# Patient Record
Sex: Male | Born: 1990 | Hispanic: No | Marital: Married | Smoking: Current some day smoker
Health system: Southern US, Community
[De-identification: ages and names within clinical notes are randomized; demographics above are authoritative.]

## PROBLEM LIST (undated history)

## (undated) DIAGNOSIS — M791 Myalgia, unspecified site: Secondary | ICD-10-CM

## (undated) DIAGNOSIS — Z0000 Encounter for general adult medical examination without abnormal findings: Secondary | ICD-10-CM

## (undated) HISTORY — PX: OTHER SURGICAL HISTORY: SHX169

## (undated) HISTORY — DX: Encounter for general adult medical examination without abnormal findings: Z00.00

---

## 2020-11-12 ENCOUNTER — Encounter: Payer: Self-pay | Admitting: Student

## 2020-11-24 ENCOUNTER — Encounter: Payer: Self-pay | Admitting: Student

## 2020-11-26 NOTE — Progress Notes (Unsigned)
Phone: [phone] In office visit-please note throughout visit Afghan interpreter was available/interpreting   Subjective:  Patient presents today to establish care.  Prior patient outside of country- from [place] .  Chief Complaint  Patient presents with  . New Patient (Initial Visit)  . GI Problem  . Waist problem     Left side of his waist    See problem oriented charting   The following were reviewed and entered/updated in epic: Past Medical History:  Diagnosis Date  . Healthy adult on routine physical examination    Patient Active Problem List   Diagnosis Date Noted  . GERD (gastroesophageal reflux disease) 11/27/2020   Past Surgical History:  Procedure Laterality Date  . none      Family History  Problem Relation Age of Onset  . Healthy Mother   . Hypertension Father   . Healthy Sister   . Healthy Brother   . Other Maternal Grandfather        died of "black magic"- disordered thinking reportedly  . Heart attack Paternal Grandfather        66s  . Healthy Sister   . Healthy Sister   . Healthy Sister   . Healthy Sister   . Healthy Brother   . Healthy Brother   . Healthy Brother     Medications- reviewed and updated Current Outpatient Medications  Medication Sig Dispense Refill  . cyclobenzaprine (FLEXERIL) 5 MG tablet Take 1 tablet (5 mg total) by mouth at bedtime as needed for muscle spasms (for left buttocks/hip pain). 30 tablet 1  . omeprazole (PRILOSEC) 40 MG capsule Take 1 capsule (40 mg total) by mouth daily. 30 capsule 1   No current facility-administered medications for this visit.    Allergies-reviewed and updated No Known Allergies  Social History   Social History Narrative   Married (wife in [place] with him). 1 daughter 9 years old in 2022. Has his own house but looking for housing downtown so he can go play. Has a sponsor helping them- Marca Ancona and wife.    Displaced from [place]      Working in a furniture company.    Was  working in [employer]      Hobbies: soccer/futbol    Objective  Objective:  BP 114/78   Pulse (!) 57   Temp (!) 97.2 F (36.2 C) (Temporal)   Ht 5\' 8"  (1.727 m)   Wt 180 lb 3.2 oz (81.7 kg)   SpO2 98%   BMI 27.40 kg/m  Gen: NAD, resting comfortably HEENT: Mucous membranes are moist. Oropharynx normal. TM normal. Eyes: sclera and lids normal, PERRLA but medial lacrimal caruncle with a linear dark area-referred to ophthalmology Neck: no thyromegaly, no cervical lymphadenopathy CV: RRR no murmurs rubs or gallops Lungs: CTAB no crackles, wheeze, rhonchi Abdomen: soft/nontender/nondistended/normal bowel sounds. No rebound or guarding.  Ext: no edema Skin: warm, dry MSK good range of motion of left hip-no pain with direct palpation over left lateral hip but did have pain with palpation over left lateral upper buttocks   Assessment and Plan:   # Epigastric burning S:when he was 30 years old was feeling a burning sensation in upper stomach/epigastric area- read some information that drinking fluids would help so he began to drink a lot of fluids but did not help. He visited 26 doctor who gave him a medicine that really helped.  With higher stress would still have symptoms.   Was told he could eat pretty much anything but  to avoid drinks with any bubbles/beverages like red bull, ginseng, monster drinks. Worse for 2-3 weeks uif he drinks these. Feels better if just drinks water. If he doesn't drink water he feels worse.   Pain all in epigastric area and into lower chest- burning sensation- rates can be severe. Worse if he gets hungry or thirsty.   Feels like he tastes something acidic. Not sure if worse at night.  A/P: Epigastric burning sounds like it has been a recurrent issue for years not improved on what sounds to likely be antireflux medicine and more GERD-based diet. Also since symptoms worsen when he is hungry there is a chance of duodenal ulcer-we opted to treat with  omeprazole 40 mg for 2 months. He will reach out if symptoms fail to improve or if they worsen and we suggested a 30-month follow-up. -Since we do not have any baseline labs will get CBC, CMP, lipase. Also will screen lipids -No shortness of breath or exertional chest pain reported-strongly doubt cardiac cause  # Left hip pain S:fall onto left hip as part of a mission 8 months ago- pain improved then worsened again in last month or two - worse when on feet at work-Prolonged standing at work. With heavy lifting can radiate into left low back. His wife provides pressure on upper lateral buttocks on the left side and that seems to give the most relief for pain A/P: Patient primarily points over left lateral hip area but with palpation pain seems to be worse in left upper lateral buttocks-we opted to get x-rays today to rule out any bony abnormality. Patient had pretty good range of motion of the hips without pain. I think this is most likely muscular in origin given improvement with deeper massage by wife. Considered NSAIDs but with possible reflux did not think the best first choice. Since seems to be muscular-trial of muscle relaxant before bed. -Depending on progress may need referral to sports medicine  # eye lesion  S:noted dark lesion on inner portion of eye several months ago- tried to remove on his own without success A/P: Dark linear lesion of lacrimal caruncle-could certainly be a benign nevus but I have not seen many like this-I would like to get expert opinion of ophthalmology-referral was placed today   Recommended follow up: Return in about 2 months (around 01/25/2021) for follow up- or sooner if needed.  Meds ordered this encounter  Medications  . omeprazole (PRILOSEC) 40 MG capsule    Sig: Take 1 capsule (40 mg total) by mouth daily.    Dispense:  30 capsule    Refill:  1  . cyclobenzaprine (FLEXERIL) 5 MG tablet    Sig: Take 1 tablet (5 mg total) by mouth at bedtime as needed for  muscle spasms (for left buttocks/hip pain).    Dispense:  30 tablet    Refill:  1   Time Spent: 61 minutes of total time (2:17 PM- 3:08 PM, 8:12 PM- 8:22 PM) was spent on the date of the encounter performing the following actions: chart review prior to seeing the patient, obtaining history (this took increased time due to needed interpretation), performing a medically necessary exam, counseling on the treatment plan, placing orders, and documenting in our EHR.   Return precautions advised. Tana Conch, MD

## 2020-11-26 NOTE — Patient Instructions (Addendum)
Please stop by lab AND x-ray before you go- same location  For acid reflux- I want you to take omeprazole every day before breakfast.   For your hip - we will get x-ray today. I would try heating pad and massage since the pressure provides the most relief. Could also try a muscle relaxant before bed cyclobenzaprine  Stick with water- avoid beverages like sodas or energy drinks. See diet discussion below  We will call you within two weeks about your referral to eye doctor to look at lesion in left eye. If you do not hear within 2 weeks, give [place] a call.    Recommended follow up: Return in about 2 months (around 01/25/2021) for follow up- or sooner if needed.   Food Choices for Gastroesophageal Reflux Disease, Adult When you have gastroesophageal reflux disease (GERD), the foods you eat and your eating habits are very important. Choosing the right foods can help ease your discomfort. Think about working with a food expert (dietitian) to help you make good choices. What are tips for following this plan? Reading food labels  Look for foods that are low in saturated fat. Foods that may help with your symptoms include: ? Foods that have less than 5% of daily value (DV) of fat. ? Foods that have 0 grams of trans fat. Cooking  Do not fry your food.  Cook your food by baking, steaming, grilling, or broiling. These are all methods that do not need a lot of fat for cooking.  To add flavor, try to use herbs that are low in spice and acidity. Meal planning  Choose healthy foods that are low in fat, such as: ? Fruits and vegetables. ? Whole grains. ? Low-fat dairy products. ? Lean meats, fish, and poultry.  Eat small meals often instead of eating 3 large meals each day. Eat your meals slowly in a place where you are relaxed. Avoid bending over or lying down until 2-3 hours after eating.  Limit high-fat foods such as fatty meats or fried foods.  Limit your intake of fatty foods, such as oils,  butter, and shortening.  Avoid the following as told by your doctor: ? Foods that cause symptoms. These may be different for different people. Keep a food diary to keep track of foods that cause symptoms. ? Alcohol. ? Drinking a lot of liquid with meals. ? Eating meals during the 2-3 hours before bed.   Lifestyle  Stay at a healthy weight. Ask your doctor what weight is healthy for you. If you need to lose weight, work with your doctor to do so safely.  Exercise for at least 30 minutes on 5 or more days each week, or as told by your doctor.  Wear loose-fitting clothes.  Do not smoke or use any products that contain nicotine or tobacco. If you need help quitting, ask your doctor.  Sleep with the head of your bed higher than your feet. Use a wedge under the mattress or blocks under the bed frame to raise the head of the bed.  Chew sugar-free gum after meals. What foods should eat? Eat a healthy, well-balanced diet of fruits, vegetables, whole grains, low-fat dairy products, lean meats, fish, and poultry. Each person is different. Foods that may cause symptoms in one person may not cause any symptoms in another person. Work with your doctor to find foods that are safe for you. The items listed above may not be a complete list of what you can eat and drink.  Contact a food expert for more options.   What foods should I avoid? Limiting some of these foods may help in managing the symptoms of GERD. Everyone is different. Talk with a food expert or your doctor to help you find the exact foods to avoid, if any. Fruits Any fruits prepared with added fat. Any fruits that cause symptoms. For some people, this may include citrus fruits, such as oranges, grapefruit, pineapple, and lemons. Vegetables Deep-fried vegetables. [place] fries. Any vegetables prepared with added fat. Any vegetables that cause symptoms. For some people, this may include tomatoes and tomato products, chili peppers, onions and  garlic, and horseradish. Grains Pastries or quick breads with added fat. Meats and other proteins High-fat meats, such as fatty beef or pork, hot dogs, ribs, ham, sausage, salami, and bacon. Fried meat or protein, including fried fish and fried chicken. Nuts and nut butters, in large amounts. Dairy Whole milk and chocolate milk. Sour cream. Cream. Ice cream. Cream cheese. Milkshakes. Fats and oils Butter. Margarine. Shortening. Ghee. Beverages Coffee and tea, with or without caffeine. Carbonated beverages. Sodas. Energy drinks. Fruit juice made with acidic fruits, such as orange or grapefruit. Tomato juice. Alcoholic drinks. Sweets and desserts Chocolate and cocoa. Donuts. Seasonings and condiments Pepper. Peppermint and spearmint. Added salt. Any condiments, herbs, or seasonings that cause symptoms. For some people, this may include curry, hot sauce, or vinegar-based salad dressings. The items listed above may not be a complete list of what you should not eat and drink. Contact a food expert for more options. Questions to ask your doctor Diet and lifestyle changes are often the first steps that are taken to manage symptoms of GERD. If diet and lifestyle changes do not help, talk with your doctor about taking medicines. Where to find more information  International Foundation for Gastrointestinal Disorders: aboutgerd.org Summary  When you have GERD, food and lifestyle choices are very important in easing your symptoms.  Eat small meals often instead of 3 large meals a day. Eat your meals slowly and in a place where you are relaxed.  Avoid bending over or lying down until 2-3 hours after eating.  Limit high-fat foods such as fatty meats or fried foods. This information is not intended to replace advice given to you by your health care provider. Make sure you discuss any questions you have with your health care provider. Document Revised: 03/31/2020 Document Reviewed:  03/31/2020 Elsevier Patient Education  2021 [employer].

## 2020-11-27 ENCOUNTER — Ambulatory Visit (INDEPENDENT_AMBULATORY_CARE_PROVIDER_SITE_OTHER): Payer: Medicaid Other | Admitting: Family Medicine

## 2020-11-27 ENCOUNTER — Other Ambulatory Visit: Payer: Self-pay

## 2020-11-27 ENCOUNTER — Encounter: Payer: Self-pay | Admitting: Family Medicine

## 2020-11-27 ENCOUNTER — Ambulatory Visit (INDEPENDENT_AMBULATORY_CARE_PROVIDER_SITE_OTHER): Payer: Medicaid Other

## 2020-11-27 VITALS — BP 114/78 | HR 57 | Temp 97.2°F | Ht 68.0 in | Wt 180.2 lb

## 2020-11-27 DIAGNOSIS — H579 Unspecified disorder of eye and adnexa: Secondary | ICD-10-CM

## 2020-11-27 DIAGNOSIS — R1013 Epigastric pain: Secondary | ICD-10-CM | POA: Diagnosis not present

## 2020-11-27 DIAGNOSIS — Z13220 Encounter for screening for lipoid disorders: Secondary | ICD-10-CM

## 2020-11-27 DIAGNOSIS — K219 Gastro-esophageal reflux disease without esophagitis: Secondary | ICD-10-CM | POA: Diagnosis not present

## 2020-11-27 DIAGNOSIS — Z1159 Encounter for screening for other viral diseases: Secondary | ICD-10-CM

## 2020-11-27 DIAGNOSIS — M25552 Pain in left hip: Secondary | ICD-10-CM | POA: Diagnosis not present

## 2020-11-27 DIAGNOSIS — Z114 Encounter for screening for human immunodeficiency virus [HIV]: Secondary | ICD-10-CM

## 2020-11-27 MED ORDER — OMEPRAZOLE 40 MG PO CPDR: 40.0000 mg | DELAYED_RELEASE_CAPSULE | Freq: Every day | ORAL | 1 refills | Status: DC

## 2020-11-27 MED ORDER — CYCLOBENZAPRINE HCL 5 MG PO TABS: 5.0000 mg | ORAL_TABLET | Freq: Every evening | ORAL | 1 refills | Status: DC | PRN

## 2020-11-28 LAB — COMPREHENSIVE METABOLIC PANEL
ALT: 20 U/L (ref 0–53)
AST: 21 U/L (ref 0–37)
Albumin: 4.7 g/dL (ref 3.5–5.2)
Alkaline Phosphatase: 82 U/L (ref 39–117)
BUN: 12 mg/dL (ref 6–23)
CO2: 29 mEq/L (ref 19–32)
Calcium: 9.4 mg/dL (ref 8.4–10.5)
Chloride: 103 mEq/L (ref 96–112)
Creatinine, Ser: 0.95 mg/dL (ref 0.40–1.50)
GFR: 107.98 mL/min (ref >60.00)
Glucose, Bld: 100 mg/dL — ABNORMAL HIGH (ref 70–99)
Potassium: 3.8 mEq/L (ref 3.5–5.1)
Sodium: 140 mEq/L (ref 135–145)
Total Bilirubin: 0.5 mg/dL (ref 0.2–1.2)
Total Protein: 7.4 g/dL (ref 6.0–8.3)

## 2020-11-28 LAB — CBC WITH DIFFERENTIAL/PLATELET
Basophils Absolute: 0.1 10*3/uL (ref 0.0–0.1)
Basophils Relative: 1.2 % (ref 0.0–3.0)
Eosinophils Absolute: 0.5 10*3/uL (ref 0.0–0.7)
Eosinophils Relative: 10.4 % — ABNORMAL HIGH (ref 0.0–5.0)
HCT: 39.6 % (ref 39.0–52.0)
Hemoglobin: 12.2 g/dL — ABNORMAL LOW (ref 13.0–17.0)
Lymphocytes Relative: 32.8 % (ref 12.0–46.0)
Lymphs Abs: 1.7 10*3/uL (ref 0.7–4.0)
MCHC: 30.8 g/dL (ref 30.0–36.0)
MCV: 60.6 fl — ABNORMAL LOW (ref 78.0–100.0)
Monocytes Absolute: 0.4 10*3/uL (ref 0.1–1.0)
Monocytes Relative: 7.5 % (ref 3.0–12.0)
Neutro Abs: 2.4 10*3/uL (ref 1.4–7.7)
Neutrophils Relative %: 48.1 % (ref 43.0–77.0)
Platelets: 186 10*3/uL (ref 150.0–400.0)
RBC: 6.54 Mil/uL — ABNORMAL HIGH (ref 4.22–5.81)
RDW: 16.1 % — ABNORMAL HIGH (ref 11.5–15.5)
WBC: 5 10*3/uL (ref 4.0–10.5)

## 2020-11-28 LAB — LIPID PANEL
Cholesterol: 145 mg/dL (ref 0–200)
HDL: 33.7 mg/dL — ABNORMAL LOW (ref >39.00)
LDL Cholesterol: 79 mg/dL (ref 0–99)
NonHDL: 111.35
Total CHOL/HDL Ratio: 4
Triglycerides: 160 mg/dL — ABNORMAL HIGH (ref 0.0–149.0)
VLDL: 32 mg/dL (ref 0.0–40.0)

## 2020-11-28 LAB — LIPASE: Lipase: 23 U/L (ref 11.0–59.0)

## 2020-11-28 LAB — HIV ANTIBODY (ROUTINE TESTING W REFLEX): HIV 1&2 Ab, 4th Generation: NONREACTIVE

## 2020-11-28 LAB — HEPATITIS C ANTIBODY
Hepatitis C Ab: NONREACTIVE
SIGNAL TO CUT-OFF: 0.27 (ref <1.00)

## 2020-12-11 ENCOUNTER — Telehealth: Payer: Self-pay

## 2020-12-11 ENCOUNTER — Encounter (HOSPITAL_BASED_OUTPATIENT_CLINIC_OR_DEPARTMENT_OTHER): Payer: Self-pay | Admitting: *Deleted

## 2020-12-11 ENCOUNTER — Emergency Department (HOSPITAL_BASED_OUTPATIENT_CLINIC_OR_DEPARTMENT_OTHER)
Admission: EM | Admit: 2020-12-11 | Discharge: 2020-12-11 | Disposition: A | Discharge status: Home / Self Care | Payer: Medicaid Other | Attending: Emergency Medicine | Admitting: Emergency Medicine

## 2020-12-11 ENCOUNTER — Emergency Department (HOSPITAL_BASED_OUTPATIENT_CLINIC_OR_DEPARTMENT_OTHER): Payer: Medicaid Other

## 2020-12-11 ENCOUNTER — Other Ambulatory Visit: Payer: Self-pay

## 2020-12-11 DIAGNOSIS — F17200 Nicotine dependence, unspecified, uncomplicated: Secondary | ICD-10-CM | POA: Diagnosis not present

## 2020-12-11 DIAGNOSIS — K2900 Acute gastritis without bleeding: Secondary | ICD-10-CM | POA: Insufficient documentation

## 2020-12-11 DIAGNOSIS — R1013 Epigastric pain: Secondary | ICD-10-CM | POA: Diagnosis present

## 2020-12-11 DIAGNOSIS — K219 Gastro-esophageal reflux disease without esophagitis: Secondary | ICD-10-CM | POA: Insufficient documentation

## 2020-12-11 DIAGNOSIS — R1011 Right upper quadrant pain: Secondary | ICD-10-CM

## 2020-12-11 HISTORY — DX: Myalgia, unspecified site: M79.10

## 2020-12-11 LAB — COMPREHENSIVE METABOLIC PANEL
ALT: 17 U/L (ref 0–44)
AST: 17 U/L (ref 15–41)
Albumin: 4.6 g/dL (ref 3.5–5.0)
Alkaline Phosphatase: 74 U/L (ref 38–126)
Anion gap: 7 (ref 5–15)
BUN: 13 mg/dL (ref 6–20)
CO2: 27 mmol/L (ref 22–32)
Calcium: 9.2 mg/dL (ref 8.9–10.3)
Chloride: 105 mmol/L (ref 98–111)
Creatinine, Ser: 1.03 mg/dL (ref 0.61–1.24)
GFR, Estimated: >60 mL/min (ref >60)
Glucose, Bld: 118 mg/dL — ABNORMAL HIGH (ref 70–99)
Potassium: 4 mmol/L (ref 3.5–5.1)
Sodium: 139 mmol/L (ref 135–145)
Total Bilirubin: 0.5 mg/dL (ref 0.3–1.2)
Total Protein: 7.4 g/dL (ref 6.5–8.1)

## 2020-12-11 LAB — CBC
HCT: 41 % (ref 39.0–52.0)
Hemoglobin: 12.2 g/dL — ABNORMAL LOW (ref 13.0–17.0)
MCH: 18.4 pg — ABNORMAL LOW (ref 26.0–34.0)
MCHC: 29.8 g/dL — ABNORMAL LOW (ref 30.0–36.0)
MCV: 61.9 fL — ABNORMAL LOW (ref 80.0–100.0)
Platelets: 171 10*3/uL (ref 150–400)
RBC: 6.62 MIL/uL — ABNORMAL HIGH (ref 4.22–5.81)
RDW: 17.5 % — ABNORMAL HIGH (ref 11.5–15.5)
WBC: 5.4 10*3/uL (ref 4.0–10.5)
nRBC: 0 % (ref 0.0–0.2)

## 2020-12-11 LAB — LIPASE, BLOOD: Lipase: 27 U/L (ref 11–51)

## 2020-12-11 MED ORDER — LIDOCAINE VISCOUS HCL 2 % MT SOLN
15.0000 mL | Freq: Once | OROMUCOSAL | Status: AC
  Administered 2020-12-11: 15 mL via ORAL
  Filled 2020-12-11: qty 15

## 2020-12-11 MED ORDER — SUCRALFATE 1 G PO TABS: 1.0000 g | ORAL_TABLET | Freq: Three times a day (TID) | ORAL | 0 refills | Status: DC

## 2020-12-11 MED ORDER — PANTOPRAZOLE SODIUM 40 MG IV SOLR
40.0000 mg | Freq: Once | INTRAVENOUS | Status: AC
  Administered 2020-12-11: 40 mg via INTRAVENOUS
  Filled 2020-12-11: qty 40

## 2020-12-11 MED ORDER — ALUM & MAG HYDROXIDE-SIMETH 200-200-20 MG/5ML PO SUSP
30.0000 mL | Freq: Once | ORAL | Status: AC
  Administered 2020-12-11: 30 mL via ORAL
  Filled 2020-12-11: qty 30

## 2020-12-11 NOTE — ED Triage Notes (Addendum)
Abdominal pain for 5 years while he was in [place] and when he is in the [place] it is bothering him again. Poor appetite, sometimes he feels nauseous and sick to his stomach.  Burning sensation specially in the morning.

## 2020-12-11 NOTE — Telephone Encounter (Signed)
Pt went to ER

## 2020-12-11 NOTE — ED Provider Notes (Signed)
[facility] EMERGENCY DEPT Provider Note   CSN: [id number] Arrival date & time: 12/11/20  1701     History Chief Complaint  Patient presents with  . Abdominal Pain    Allen Berger is a 30 y.o. male.  Patient is a 30 year old male who presents with epigastric pain.  History is obtained through video language line.  Patient speaks Parsi.  He reports that he has had some pain in his epigastric area for about 5 years.  He was treated for this when he was in [place].  He recently has had some increase in symptoms since he moved to the [place].  He describes pain in his epigastrium.  Nonradiating.  He has some occasional nausea but no vomiting.  No radiation to his chest.  No shortness of breath.  No fevers.  No change in bowels.  He has been taking omeprazole with no improvement in symptoms.  He says is not really related to eating.  It hurts whether or not he eats.  He he was treated for it in [place] but he says he never got a formal diagnosis.        Past Medical History:  Diagnosis Date  . Healthy adult on routine physical examination   . Muscle pain     Patient Active Problem List   Diagnosis Date Noted  . GERD (gastroesophageal reflux disease) 11/27/2020    Past Surgical History:  Procedure Laterality Date  . none         Family History  Problem Relation Age of Onset  . Healthy Mother   . Hypertension Father   . Healthy Sister   . Healthy Brother   . Other Maternal Grandfather        died of "black magic"- disordered thinking reportedly  . Heart attack Paternal Grandfather        38s  . Healthy Sister   . Healthy Sister   . Healthy Sister   . Healthy Sister   . Healthy Brother   . Healthy Brother   . Healthy Brother     Social History   Tobacco Use  . Smoking status: Current Some Day Smoker    Start date: 11/20/2020  . Smokeless tobacco: Never Used  . Tobacco comment: about 2 a day now- heavier in [place]  Vaping Use  .  Vaping Use: Never used  Substance Use Topics  . Alcohol use: Not Currently    Comment: rare social  . Drug use: Never    Home Medications Prior to Admission medications   Medication Sig Start Date End Date Taking? Authorizing Provider  sucralfate (CARAFATE) 1 g tablet Take 1 tablet (1 g total) by mouth 4 (four) times daily -  with meals and at bedtime. 12/11/20  Yes Rolan Bucco, MD  cyclobenzaprine (FLEXERIL) 5 MG tablet Take 1 tablet (5 mg total) by mouth at bedtime as needed for muscle spasms (for left buttocks/hip pain). 11/27/20   Shelva Majestic, MD  omeprazole (PRILOSEC) 40 MG capsule Take 1 capsule (40 mg total) by mouth daily. 11/27/20   Shelva Majestic, MD    Allergies    Patient has no known allergies.  Review of Systems   Review of Systems  Constitutional: Negative for chills, diaphoresis, fatigue and fever.  HENT: Negative for congestion, rhinorrhea and sneezing.   Eyes: Negative.   Respiratory: Negative for cough, chest tightness and shortness of breath.   Cardiovascular: Negative for chest pain and leg swelling.  Gastrointestinal: Positive for  abdominal pain and nausea. Negative for blood in stool, diarrhea and vomiting.  Genitourinary: Negative for difficulty urinating, flank pain, frequency and hematuria.  Musculoskeletal: Negative for arthralgias and back pain.  Skin: Negative for rash.  Neurological: Negative for dizziness, speech difficulty, weakness, numbness and headaches.    Physical Exam Updated Vital Signs BP 115/65   Pulse 71   Temp 98.2 F (36.8 C) (Oral)   Resp 18   Ht 5\' 8"  (1.727 m)   Wt 79 kg   SpO2 98%   BMI 26.48 kg/m   Physical Exam Constitutional:      Appearance: He is well-developed.  HENT:     Head: Normocephalic and atraumatic.  Eyes:     Pupils: Pupils are equal, round, and reactive to light.  Cardiovascular:     Rate and Rhythm: Normal rate and regular rhythm.     Heart sounds: Normal heart sounds.  Pulmonary:      Effort: Pulmonary effort is normal. No respiratory distress.     Breath sounds: Normal breath sounds. No wheezing or rales.  Chest:     Chest wall: No tenderness.  Abdominal:     General: Bowel sounds are normal.     Palpations: Abdomen is soft.     Tenderness: There is abdominal tenderness in the epigastric area. There is no guarding or rebound.  Musculoskeletal:        General: Normal range of motion.     Cervical back: Normal range of motion and neck supple.  Lymphadenopathy:     Cervical: No cervical adenopathy.  Skin:    General: Skin is warm and dry.     Findings: No rash.  Neurological:     Mental Status: He is alert and oriented to person, place, and time.     ED Results / Procedures / Treatments   Labs (all labs ordered are listed, but only abnormal results are displayed) Labs Reviewed  CBC - Abnormal; Notable for the following components:      Result Value   RBC 6.62 (*)    Hemoglobin 12.2 (*)    MCV 61.9 (*)    MCH 18.4 (*)    MCHC 29.8 (*)    RDW 17.5 (*)    All other components within normal limits  COMPREHENSIVE METABOLIC PANEL - Abnormal; Notable for the following components:   Glucose, Bld 118 (*)    All other components within normal limits  LIPASE, BLOOD    EKG None   ED ECG REPORT   Date: 12/11/2020  EKG Time: 9:09 PM  Rate: 58  Rhythm: normal sinus rhythm,  normal EKG, normal sinus rhythm, there are no previous tracings available for comparison  Axis: normal  Intervals:none  ST&T Change: none  Narrative Interpretation: none            Radiology 02/10/2021 Abdomen Limited RUQ (LIVER/GB)  Result Date: 12/11/2020 CLINICAL DATA:  Right upper quadrant pain EXAM: ULTRASOUND ABDOMEN LIMITED RIGHT UPPER QUADRANT COMPARISON:  None. FINDINGS: Gallbladder: No gallstones or wall thickening visualized. No sonographic Murphy sign noted by sonographer. Common bile duct: Diameter: 3 mm Liver: Mild increased echogenicity is noted likely related to fatty  infiltration. No focal mass is seen. Portal vein is patent on color Doppler imaging with normal direction of blood flow towards the liver. Other: None. IMPRESSION: Mild changes of fatty infiltration. No other focal abnormality noted. Electronically Signed   By: 02/10/2021 M.D.   On: 12/11/2020 20:34    Procedures Procedures  Medications Ordered in ED Medications  alum & mag hydroxide-simeth (MAALOX/MYLANTA) 200-200-20 MG/5ML suspension 30 mL (30 mLs Oral Given 12/11/20 1924)    And  lidocaine (XYLOCAINE) 2 % viscous mouth solution 15 mL (15 mLs Oral Given 12/11/20 1923)  pantoprazole (PROTONIX) injection 40 mg (40 mg Intravenous Given 12/11/20 1924)    ED Course  I have reviewed the triage vital signs and the nursing notes.  Pertinent labs & imaging results that were available during my care of the patient were reviewed by me and considered in my medical decision making (see chart for details).    MDM Rules/Calculators/A&P                          Patient is a 30 year old male who presents with epigastric pain which he describes as a burning sensation.  This is been going on for about 5 years but has recently gotten worse.  His symptoms sound consistent with gastritis versus peptic ulcer disease.  He recently establish care with a PCP here and was started on Prilosec.  He does not report any improvement in symptoms on this.  His labs are nonconcerning.  There is no suggestions of pancreatitis.  He had an ultrasound which shows no evidence of gallbladder disease.  He was given a GI cocktail which did not significantly help his symptoms.  We will add Carafate to his Prilosec regimen and have him follow back up with his PCP.  He may need an EGD.  Patient was discharged home in good condition.  He was given instructions on a GERD diet.  Return precautions given.  All instructions were given per [place] interpreter. Final Clinical Impression(s) / ED Diagnoses Final diagnoses:  RUQ pain  Acute  gastritis without hemorrhage, unspecified gastritis type    Rx / DC Orders ED Discharge Orders         Ordered    sucralfate (CARAFATE) 1 g tablet  3 times daily with meals & bedtime        12/11/20 2109           Rolan Bucco, MD 12/11/20 2111

## 2020-12-11 NOTE — Telephone Encounter (Signed)
Pt is being sent to the ED by our Nurse Triage. Pt will be missing work for this. Pt is requesting a note that can be picked up tomorrow morning at 10 am, stating that our office advised pt to go to ED tonight.

## 2020-12-11 NOTE — Telephone Encounter (Signed)
Nurse Assessment Nurse: Anner Crete, RN, Olegario Messier Date/Time (Eastern Time): 12/11/2020 3:23:06 PM Confirm and document reason for call. If symptomatic, describe symptoms. ---Caller stated he is having severe abdominal pain. Does the patient have any new or worsening symptoms? ---Yes Will a triage be completed? ---Yes Related visit to physician within the last 2 weeks? ---Yes Does the PT have any chronic conditions? (i.e. diabetes, asthma, this includes High risk factors for pregnancy, etc.) ---No Is this a behavioral health or substance abuse call? ---No Guidelines Guideline Title Affirmed Question Affirmed Notes Nurse Date/Time Lamount Cohen Time) Abdominal Pain - Upper [1] SEVERE pain (e.g., excruciating) AND [2] present > 1 hour Wells, RN, Olegario Messier 12/11/2020 3:33:20 PM Disp. Time Lamount Cohen Time) Disposition Final User 12/11/2020 3:19:30 PM Send to Urgent Danella Penton 12/11/2020 3:36:31 PM Go to ED Now Yes Anner Crete, RN, Sammuel Bailiff Disagree/Comply Disagree PLEASE NOTE: All timestamps contained within this report are represented as [place] Standard Time. CONFIDENTIALTY NOTICE: This fax transmission is intended only for the addressee. It contains information that is legally privileged, confidential or otherwise protected from use or disclosure. If you are not the intended recipient, you are strictly prohibited from reviewing, disclosing, copying using or disseminating any of this information or taking any action in reliance on or regarding this information. If you have received this fax in error, please notify us immediately by telephone so that we can arrange for its return to [place]. Phone: [phone], Toll-Free: [phone], Fax: [phone] Page: 2 of 2 Call Id: [id number] Caller Understands Yes PreDisposition Call Doctor Care Advice Given Per Guideline GO TO ED NOW: * You need to be seen in the Emergency Department. * Another adult should drive. NOTHING BY MOUTH: * Do not eat or drink anything  for now. CARE ADVICE given per Abdominal Pain, Upper (Adult) guideline. * Leave now. Drive carefully. * Bring a list of your current medicines when you go to the Emergency Department (ER). Comments User: Alphonzo Cruise, RN Date/Time Lamount Cohen Time): 12/11/2020 3:37:46 PM Persian Interpreter # [id number] User: Alphonzo Cruise, RN Date/Time Lamount Cohen Time): 12/11/2020 3:40:51 PM after my previous documentation partient changed his mind and does plan to go to the ER Referrals GO TO FACILITY REFUSE

## 2020-12-11 NOTE — Telephone Encounter (Signed)
[facility] - Client TELEPHONE ADVICE RECORD AccessNurse Patient Name: Allen Berger Gender: Male DOB: 02/27/1991 Age: 30 Y 0 M 0 D Return Phone Number: [phone] (Primary) Address: City/State/Zip: [place] [address] Client [facility] - Client Client Site [facility] Physician Hunter, Stephen- MD Contact Type Call Who Is Calling Patient / Member / Family / Caregiver Call Type Triage / Clinical Relationship To Patient Self Return Phone Number [phone] (Primary) Chief Complaint ABDOMINAL PAIN - Severe and only in abdomen Reason for Call Symptomatic / Request for Health Information Initial Comment Caller Claire from the clinic, says pt does not speak good English. Pt has been having some abdominal pain and it has gotten worse today. Claire advised to speak slow. Translation No Nurse Assessment Nurse: Wells, RN, Kathy Date/Time (Eastern Time): 12/11/2020 3:23:06 PM Confirm and document reason for call. If symptomatic, describe symptoms. ---Caller stated he is having severe abdominal pain. Does the patient have any new or worsening symptoms? ---Yes Will a triage be completed? ---Yes Related visit to physician within the last 2 weeks? ---Yes Does the PT have any chronic conditions? (i.e. diabetes, asthma, this includes High risk factors for pregnancy, etc.) ---No Is this a behavioral health or substance abuse call? ---No Guidelines Guideline Title Affirmed Question Affirmed Notes Nurse Date/Time (Eastern Time) Abdominal Pain - Upper [1] SEVERE pain (e.g., excruciating) AND [2] present > 1 hour Wells, RN, Kathy 12/11/2020 3:33:20 PM Disp. Time (Eastern Time) Disposition Final User 12/11/2020 3:19:30 PM Send to Urgent Queue Hill, Laqundna 12/11/2020 3:36:31 PM Go to ED Now Yes Wells, RN, Kathy Caller Disagree/Comply Disagree PLEASE NOTE: All timestamps contained within this  report are represented as Eastern Standard Time. CONFIDENTIALTY NOTICE: This fax transmission is intended only for the addressee. It contains information that is legally privileged, confidential or otherwise protected from use or disclosure. If you are not the intended recipient, you are strictly prohibited from reviewing, disclosing, copying using or disseminating any of this information or taking any action in reliance on or regarding this information. If you have received this fax in error, please notify us immediately by telephone so that we can arrange for its return to us. Phone: [phone], Toll-Free: [phone], Fax: [phone] Page: 2 of 2 Call Id: [id number] Caller Understands Yes PreDisposition Call Doctor Care Advice Given Per Guideline GO TO ED NOW: * You need to be seen in the Emergency Department. * Another adult should drive. NOTHING BY MOUTH: * Do not eat or drink anything for now. CARE ADVICE given per Abdominal Pain, Upper (Adult) guideline. * Leave now. Drive carefully. * Bring a list of your current medicines when you go to the Emergency Department (ER). Comments User: Kathy, Wells, RN Date/Time (Eastern Time): 12/11/2020 3:37:46 PM Persian Interpreter # [id number] User: Kathy, Wells, RN Date/Time (Eastern Time): 12/11/2020 3:40:51 PM after my previous documentation partient changed his mind and does plan to go to the ER Referrals GO TO FACILITY REFUSE 

## 2020-12-12 ENCOUNTER — Ambulatory Visit: Payer: Medicaid Other | Admitting: Physician Assistant

## 2020-12-12 ENCOUNTER — Telehealth: Payer: Self-pay

## 2020-12-12 ENCOUNTER — Other Ambulatory Visit: Payer: Self-pay

## 2020-12-12 DIAGNOSIS — R1013 Epigastric pain: Secondary | ICD-10-CM

## 2020-12-12 NOTE — Telephone Encounter (Signed)
May refer to Dr. Rhea Belton under epigastric pain.  Please make sure patient knows he is on appropriate treatment if this is an ulcer-adding the Carafate may help his symptoms-it does take some time for ulcers to heal.  Also please note seeing Dr. Rhea Belton directly may take more time than doing a general referral to GI but if patient still desires Dr. Rhea Belton specifically-I am fine with that as I think is reasonable to see if continued use of the medicine will help further

## 2020-12-12 NOTE — Telephone Encounter (Signed)
Unable to get an interpreter on the phone. Referral has been placed but still need to speak with the patient.

## 2020-12-12 NOTE — Telephone Encounter (Signed)
Charlette Caffey called on patients behalf stating patient is in need of a referral to [facility]  to see dr J.purtle immediately for possibly needing an endoscopy. Patient was seen in ER last night for a stomach ulcer .

## 2020-12-12 NOTE — Telephone Encounter (Signed)
Please advise. Is it okay for me to place the referral.

## 2020-12-29 NOTE — Patient Instructions (Addendum)
Please take sheet to [facility] for semen analysis. Address: [address] [place], [place] [address] - Please call [phone] an appointment. We did not translate this below- if possible team/translator please help him call today  Team since we have an interpreter here- lets go ahead and set up visit with stomach doctors/GI - has been referred before  Continue prilosec/omeprazole but increase to twice a day before meals for acid reflux  Also refilled carafate which is the medicine that coats your stomach  Due to anemia lets have you take iron every 3 days- this may make your stool darker  Use antibacterial ointment four times a day as I showed you for 5-7 days. If any blurry vision- go to emergency room. If new or worsening symptoms otherwise such as more redness please see [place] back  You requested a Chad translation from google translate. We cannot guarantee medical accuracy of this- please review with translator and correct anything before you leave     ? ?  ? ? ?  ? ?. ?  /    ?- ~ ? ~   ?~   ?   ?  ?? ~   ? -  ??? ? ?   ~ /GI  ~? -     .  ?~/   ?  ? ~ ?         ? ?  ? ~    ~ ??  ~    ?    ? ~ ?  ?   3  ?~    ~? - ? ~     ?  ~   ~       5-7        ? ~?  ~?.    ? ? -  Z  ~?.   ? ?    ? ?   ? ?     ~?     ?   ? ~?.  ? ?  ~? ?   ? ~? -    ~   ? ~?   ??  ? ~?

## 2020-12-29 NOTE — Progress Notes (Signed)
Phone [phone] In person visit   Subjective:   Allen Berger is a 30 y.o. year old very pleasant male patient who presents for/with See problem oriented charting Chief Complaint  Patient presents with  . Gastroesophageal Reflux    Heartburn very bad   . Eye Problem    Patients right eye is red , burning sensation, 2-3 days    This visit occurred during the SARS-CoV-2 public health emergency.  Safety protocols were in place, including screening questions prior to the visit, additional usage of staff PPE, and extensive cleaning of exam room while observing appropriate contact time as indicated for disinfecting solutions.   Past Medical History-  Patient Active Problem List   Diagnosis Date Noted  . GERD (gastroesophageal reflux disease) 11/27/2020    Medications- reviewed and updated Current Outpatient Medications  Medication Sig Dispense Refill  . cyclobenzaprine (FLEXERIL) 5 MG tablet Take 1 tablet (5 mg total) by mouth at bedtime as needed for muscle spasms (for left buttocks/hip pain). 30 tablet 1  . erythromycin ophthalmic ointment One-half inch (1.25 cm) four times daily for 5 to 7 days to affected eye (left eye) 3.5 g 0  . ferrous sulfate 325 (65 FE) MG EC tablet Take 1 tablet (325 mg total) by mouth every 3 (three) days. 39 tablet 1  . omeprazole (PRILOSEC) 40 MG capsule Take 1 capsule (40 mg total) by mouth 2 (two) times daily before a meal. 60 capsule 1  . sucralfate (CARAFATE) 1 g tablet Take 1 tablet (1 g total) by mouth 4 (four) times daily -  with meals and at bedtime. 30 tablet 0   No current facility-administered medications for this visit.     Objective:  BP 115/70   Pulse 68   Temp 98.3 F (36.8 C) (Temporal)   Ht 5\' 8"  (1.727 m)   Wt 178 lb 9.6 oz (81 kg)   SpO2 100%   BMI 27.16 kg/m  Gen: NAD, resting comfortably Gross vision intact. Left eye erythematous on lateral portion more intensely. On fluorescein eye stain test- very slight focus of  fluorescein concentration with no extravasation-consistent with slight scleral abrasion. CV: RRR no murmurs rubs or gallops Lungs: CTAB no crackles, wheeze, rhonchi Abdomen: soft/nontender other than very mild tenderness with deep palpation epigastric area/nondistended/normal bowel sounds. No rebound or guarding.  Ext: no edema Skin: warm, dry MSK: Did not reexamine hip today    Assessment and Plan  # GERD thought as potential cause of epigastric abdominal pain though potential ulceration as well S:Medication: Prilosec 40Mg   Patient continues have epigastric abdominal pain that even led him to have emergency room visit on 12/11/2020 after being started on Prilosec 11/27/2020 in our office.  No radiation of pain.  Patient was seen in the emergency room approximately 20 days ago and had an ultrasound of right upper quadrant to evaluate pain-no gallstones were found.  Some fatty liver was noted.  Patient was already on Prilosec 40 mg once daily.  They added Carafate in the emergency room.  Pain prior to meds up to 10/10, now with prilosec as low as 2-3/10 but occasionally stil up to 10/10. He felt like carafate helped him even more A/P: 30 year old male with 5 years of epigastric pain starting out when he was in [place]-improved with prior treatment there-it sounds like reflux treatment.  More recently, patient has had significant improvement in symptoms on Prilosec over the last month-we will increase to twice daily Prilosec.  Team will help set  him up with GI referral-I worry about ulceration with anemia.  We recommended he start iron every 72 hours due to microcytic anemia.  He feels like Carafate has been helpful so we will do a refill on this as well. -For orthopedic issues would consider NSAIDs but with possible ulceration we will hold off for now and do referral to sports medicine -With anemia of source for bleeding found on potential endoscopy or if does not improve with therapy may need  colonoscopy as well-defer to expertise of GI  #Left hip pain S: Ongoing issues with left hip pain/left buttocks pain-tight musculature previously and we tried Flexeril but this did not give him any benefit A/P: Referral to sports medicine was placed today  # Fertility concerns S: he and wife have been trying for 4 years and not gotten pregnant . 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  No changes in vision or blurred vision or double vision.  No fever chills reported.  Mild redness in the left eye but no burning. A/P: On fluorescein eye stain there appears to be a very small scleral abrasion around the area of most intense redness-we will cover with erythromycin for 5 to 7 days-if new or worsening symptoms particularly blurred vision should seek care immediately -I wonder if he has had some allergies and has been rubbing his eye.  This would also cover for pinkeye but I do not think this is pinkeye   Recommended follow up: We did not schedule acute follow-up-GI will be the next step as well as sports medicine follow-up  Lab/Order associations:   ICD-10-CM   1. Epigastric abdominal pain  R10.13   2. Gastroesophageal reflux disease without esophagitis  K21.9   3. Fertility testing  Z31.41 Semen Analysis, Basic  4. Left hip pain  M25.552 Ambulatory referral to Sports Medicine  5. Redness of left eye  H57.89     Meds ordered this encounter  Medications  . DISCONTD: omeprazole (PRILOSEC) 40 MG capsule    Sig: Take 1 capsule (40 mg total) by mouth 2 (two) times daily before a meal.    Dispense:  60 capsule    Refill:  1  . sucralfate (CARAFATE) 1 g tablet    Sig: Take 1 tablet (1 g total)  by mouth 4 (four) times daily -  with meals and at bedtime.    Dispense:  30 tablet    Refill:  0  . ferrous sulfate 325 (65 FE) MG EC tablet    Sig: Take 1 tablet (325 mg total) by mouth every 3 (three) days.    Dispense:  39 tablet    Refill:  1  . erythromycin ophthalmic ointment    Sig: One-half inch (1.25 cm) four times daily for 5 to 7 days to affected eye (left eye)    Dispense:  3.5 g    Refill:  0  . omeprazole (PRILOSEC) 40 MG capsule    Sig: Take 1 capsule (40 mg total) by mouth 2 (two) times daily before a meal.    Dispense:  60 capsule    Refill:  1    Time Spent: > 50 minutes of  total time (10:18 AM- 11:10 AM) was spent on the date of the encounter performing the following actions: chart review prior to seeing the patient, obtaining history, performing a medically necessary exam, counseling on the treatment plan, placing orders, and documenting in our EHR.  Extended time needed due to translation difficulties.  Return precautions advised.  Tana Conch, MD

## 2020-12-31 ENCOUNTER — Other Ambulatory Visit: Payer: Self-pay

## 2020-12-31 ENCOUNTER — Encounter: Payer: Self-pay | Admitting: Family Medicine

## 2020-12-31 ENCOUNTER — Ambulatory Visit (INDEPENDENT_AMBULATORY_CARE_PROVIDER_SITE_OTHER): Payer: Medicaid Other | Admitting: Family Medicine

## 2020-12-31 VITALS — BP 115/70 | HR 68 | Temp 98.3°F | Ht 68.0 in | Wt 178.6 lb

## 2020-12-31 DIAGNOSIS — R1013 Epigastric pain: Secondary | ICD-10-CM

## 2020-12-31 DIAGNOSIS — M25552 Pain in left hip: Secondary | ICD-10-CM

## 2020-12-31 DIAGNOSIS — K219 Gastro-esophageal reflux disease without esophagitis: Secondary | ICD-10-CM | POA: Diagnosis not present

## 2020-12-31 DIAGNOSIS — H5789 Other specified disorders of eye and adnexa: Secondary | ICD-10-CM

## 2020-12-31 DIAGNOSIS — Z3141 Encounter for fertility testing: Secondary | ICD-10-CM

## 2020-12-31 MED ORDER — ERYTHROMYCIN 5 MG/GM OP OINT: TOPICAL_OINTMENT | OPHTHALMIC | 0 refills | Status: AC

## 2020-12-31 MED ORDER — FERROUS SULFATE 325 (65 FE) MG PO TBEC: 325.0000 mg | DELAYED_RELEASE_TABLET | ORAL | 1 refills | Status: AC

## 2020-12-31 MED ORDER — OMEPRAZOLE 40 MG PO CPDR
40.0000 mg | DELAYED_RELEASE_CAPSULE | Freq: Two times a day (BID) | ORAL | 1 refills | Status: DC
Start: 2020-12-31 — End: 2020-12-31

## 2020-12-31 MED ORDER — SUCRALFATE 1 G PO TABS: 1.0000 g | ORAL_TABLET | Freq: Three times a day (TID) | ORAL | 0 refills | Status: DC

## 2020-12-31 MED ORDER — OMEPRAZOLE 40 MG PO CPDR: 40.0000 mg | DELAYED_RELEASE_CAPSULE | Freq: Two times a day (BID) | ORAL | 1 refills | Status: DC

## 2021-01-02 NOTE — Progress Notes (Signed)
Subjective:    I'm seeing this patient as a consultation for:  Dr. Durene Cal. Note will be routed back to referring provider/PCP.  CC: L hip pain  I, Molly Weber, LAT, ATC, am serving as scribe for Dr. Clementeen Graham.  HPI: Pt is a 30 y/o male presenting w/ c/o L buttocks/post thigh pain x 3 months when he was climbing a rope up a wall and fell, landing on the L side of his back and buttocks .  He locates his pain specifically to the L side of his low back that radiates into his L post thigh to his knee.  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  Radiating pain: yes into L buttocks and post thigh to his knee Low back pain: yes L LE numbness/tingling: yes, tingling into L calf Aggravating factors: prolonged standing; prolonged sitting;  Treatments tried: Flexeril; heat;   Diagnostic imaging: L hip XR- 11/27/20  Past medical history, Surgical history, Family history, Social history, Allergies, and medications have been entered into the medical record, reviewed.   Review of Systems: No new headache, visual changes, nausea, vomiting, diarrhea, constipation, dizziness, abdominal pain, skin rash, fevers, chills, night sweats, weight loss, swollen lymph nodes, body aches, joint swelling, muscle aches, chest pain, shortness of breath, mood changes, visual or auditory hallucinations.   Objective:    Vitals:   01/05/21 1100  BP: 100/70  Pulse: 62  SpO2: 98%   General: Well Developed, well nourished, and in no acute distress.  Neuro/Psych: Alert and oriented x3, extra-ocular muscles intact, able to move all 4 extremities, sensation grossly intact. Skin: Warm and dry, no rashes noted.  Respiratory: Not using accessory muscles, speaking in full sentences, trachea midline.  Cardiovascular: Pulses palpable, no extremity edema. Abdomen: Does not appear distended. MSK: L-spine normal-appearing Nontender midline. Tender palpation mild left lower  perispinal musculature. Decreased lumbar motion with pain reproduced with lateral flexion and rotation. Positive left-sided slump test. Lower extremity strength is intact . Reflexes are intact and equal bilateral extremities as is sensation.  Left hip normal-appearing normal motion nontender normal strength.  Lab and Radiology Results  X-ray L-spine obtained today personally and independently interpreted. No fractures or severe degenerative changes.  No malalignment. Await formal radiology review  EXAM: DG HIP (WITH OR WITHOUT PELVIS) 2-3V LEFT  COMPARISON:  None.  FINDINGS: There is no evidence of hip fracture or dislocation. There is no evidence of arthropathy or other focal bone abnormality.  IMPRESSION: Negative.   Electronically Signed   By: Marlan Palau M.D.   On: 11/29/2020 09:09  I, Clementeen Graham, personally (independently) visualized and performed the interpretation of the images attached in this note.   Impression and Recommendations:    Assessment and Plan: 30 y.o. male with left buttocks pain radiating down the left posterior leg.  This is consistent with sciatica.  Piriformis syndrome is a possibility but is less likely as pain was not reproduced and no weakness was identified with hip abduction and external rotation strength testing.  Discussed options.  Plan for course of prednisone and gabapentin.  Reassess in 1 month.  Consider MRI if not improved at that point.  Visit conducted using a Dari interpreter.Marland Kitchen  PDMP not reviewed this encounter. Orders Placed This Encounter  Procedures  . DG Lumbar Spine 2-3 Views    Standing Status:   Future    Number of Occurrences:   1    Standing Expiration Date:  02/04/2021    Order Specific Question:   Reason for Exam (SYMPTOM  OR DIAGNOSIS REQUIRED)    Answer:   L leg pain    Order Specific Question:   Preferred imaging location?    Answer:   [facility]   Meds ordered this encounter  Medications  .  predniSONE (DELTASONE) 50 MG tablet    Sig: Take 1 tablet (50 mg total) by mouth daily.    Dispense:  5 tablet    Refill:  0  . gabapentin (NEURONTIN) 300 MG capsule    Sig: Take 1 capsule (300 mg total) by mouth 3 (three) times daily as needed.    Dispense:  90 capsule    Refill:  3    Discussed warning signs or symptoms. Please see discharge instructions. Patient expresses understanding.   The above documentation has been reviewed and is accurate and complete Clementeen Graham, M.D.

## 2021-01-05 ENCOUNTER — Ambulatory Visit (INDEPENDENT_AMBULATORY_CARE_PROVIDER_SITE_OTHER): Payer: Medicaid Other | Admitting: Family Medicine

## 2021-01-05 ENCOUNTER — Telehealth: Payer: Self-pay

## 2021-01-05 ENCOUNTER — Ambulatory Visit (INDEPENDENT_AMBULATORY_CARE_PROVIDER_SITE_OTHER): Payer: Medicaid Other

## 2021-01-05 ENCOUNTER — Other Ambulatory Visit: Payer: Self-pay

## 2021-01-05 ENCOUNTER — Encounter: Payer: Self-pay | Admitting: Family Medicine

## 2021-01-05 VITALS — BP 100/70 | HR 62 | Ht 68.0 in | Wt 179.4 lb

## 2021-01-05 DIAGNOSIS — M5442 Lumbago with sciatica, left side: Secondary | ICD-10-CM

## 2021-01-05 DIAGNOSIS — M79605 Pain in left leg: Secondary | ICD-10-CM

## 2021-01-05 DIAGNOSIS — G8929 Other chronic pain: Secondary | ICD-10-CM

## 2021-01-05 MED ORDER — GABAPENTIN 300 MG PO CAPS: 300.0000 mg | ORAL_CAPSULE | Freq: Three times a day (TID) | ORAL | 3 refills | Status: AC | PRN

## 2021-01-05 MED ORDER — PREDNISONE 50 MG PO TABS: 50.0000 mg | ORAL_TABLET | Freq: Every day | ORAL | 0 refills | Status: AC

## 2021-01-05 NOTE — Patient Instructions (Signed)
Thank you for coming in today.  Follow up in 1 month.   Schedule with interpreter.

## 2021-01-05 NOTE — Telephone Encounter (Signed)
Received call from Ms Allen Berger patient co-sponsor. Patient would like to establish care with congregational nurse for transportation assistance. Patient sponsor advised to call back with appointments dates and time and transportation assistance will be provided.  Arman Bogus RN BSn PCCN  [facility] [phone]-cell [phone]-office

## 2021-01-06 NOTE — Progress Notes (Signed)
X-ray lumbar spine is normal-appearing

## 2021-01-08 ENCOUNTER — Telehealth: Payer: Self-pay

## 2021-01-08 NOTE — Telephone Encounter (Signed)
Orders has been faxed.

## 2021-01-08 NOTE — Telephone Encounter (Signed)
[facility] is needing orders for patient's seminalysis  Fax: [phone] Attn: Judeth Cornfield

## 2021-01-08 NOTE — Telephone Encounter (Signed)
Orders were sent with pt last week. Will have Jazz fax them.

## 2021-01-20 ENCOUNTER — Telehealth: Payer: Self-pay

## 2021-01-20 ENCOUNTER — Ambulatory Visit: Payer: Medicaid Other | Admitting: Nurse Practitioner

## 2021-01-20 NOTE — Telephone Encounter (Signed)
Called [name] back to let her know that there is not such thing as placing an order for transportation. She stated she is going to let pt book his own Allen Berger

## 2021-01-20 NOTE — Telephone Encounter (Signed)
[phone]  [name], Pt,'s friend, called requesting we put in an order for transportation services so pt can go to his lab appt tomorrow in [place]. She is requesting we call her back with the answer

## 2021-01-20 NOTE — Telephone Encounter (Signed)
Called and spoke with Tammy since I am not familiar with this, we dont put orders in for this. She is going to call you regarding getting this taken care of.

## 2021-01-21 ENCOUNTER — Encounter: Payer: Self-pay | Admitting: Family Medicine

## 2021-02-03 NOTE — Progress Notes (Deleted)
   I, Christoper Fabian, LAT, ATC, am serving as scribe for Dr. Clementeen Graham.  Allen Berger is a 30 y.o. male who presents to [facility] today for f/u of L-sided low back and buttock pain, radiating into his L post thigh.  He was last seen by Dr. Denyse Amass on 01/05/21 and was prescribed prednisone and gabapentin.  Since his last visit, pt reports  Diagnostic testing: L-spine XR- 01/05/21; L hip XR- 11/27/20  Pertinent review of systems: ***  Relevant historical information: ***   Exam:  There were no vitals taken for this visit. General: Well Developed, well nourished, and in no acute distress.   MSK: ***    Lab and Radiology Results No results found for this or any previous visit (from the past 72 hour(s)). No results found.     Assessment and Plan: 30 y.o. male with ***   PDMP not reviewed this encounter. No orders of the defined types were placed in this encounter.  No orders of the defined types were placed in this encounter.    Discussed warning signs or symptoms. Please see discharge instructions. Patient expresses understanding.   ***

## 2021-02-04 ENCOUNTER — Ambulatory Visit: Payer: Medicaid Other | Admitting: Family Medicine

## 2021-02-10 ENCOUNTER — Telehealth: Payer: Self-pay

## 2021-02-10 NOTE — Telephone Encounter (Signed)
Allen Berger can you handle this for me please?

## 2021-02-10 NOTE — Telephone Encounter (Signed)
Patients wife called to check on lab work patient had done at [facility] said they havent heard anything an would like a call back. Please make sure to call an interpreter before calling

## 2021-02-13 NOTE — Telephone Encounter (Signed)
Spoke with patient and his wife. Did advise the patient that the lab results weren't back yet will call him once their received.

## 2021-02-17 ENCOUNTER — Telehealth: Payer: Self-pay

## 2021-02-17 NOTE — Telephone Encounter (Signed)
Pt called about his results from his sperm test. Please advise.

## 2021-02-18 NOTE — Telephone Encounter (Signed)
Can you check into this please

## 2021-02-25 NOTE — Telephone Encounter (Signed)
Unable to reach patient or his wife. I did leave a message for the patient to return call. This is my 3rd attempt.

## 2021-03-03 NOTE — Telephone Encounter (Signed)
Can you check into this please and notify pt of results if back.

## 2021-03-03 NOTE — Telephone Encounter (Signed)
[name] called in and stated they talked to [facility] and said the results were back but they do not see the results in patients mychart. Patient is wanting to know what his results are.

## 2021-03-09 ENCOUNTER — Telehealth: Payer: Self-pay | Admitting: Family Medicine

## 2021-03-09 DIAGNOSIS — R868 Other abnormal findings in specimens from male genital organs: Secondary | ICD-10-CM

## 2021-03-09 NOTE — Telephone Encounter (Signed)
Patient is aware of his lab results, gave a verbal understanding.

## 2021-03-09 NOTE — Telephone Encounter (Signed)
Spurling test came back and is largely normal.  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  This could be caused by a number of things.  Unfortunately this does decrease the chance of getting pregnant with the sperm.  Smoking can actually cause this to occur-would recommend quitting smoking.  Also infections in the urine can cause this- I am going to check him for standard urine infections but also STDs to be on the safe side.  Finally I want to check a PSA test because sometimes an inflamed prostate can cause bacteria fighting cells in the sperm.  Team please have him come back for blood work-help him schedule a visit.  He will need an interpreter to interpret for him with Dari or Chad I believe

## 2021-03-10 NOTE — Telephone Encounter (Signed)
Please schedule pt for lab and interpreter.

## 2021-03-10 NOTE — Telephone Encounter (Signed)
Called and lm for pt tcb. 

## 2021-05-19 ENCOUNTER — Telehealth: Payer: Self-pay

## 2021-05-19 MED ORDER — OMEPRAZOLE 40 MG PO CPDR: 40.0000 mg | DELAYED_RELEASE_CAPSULE | Freq: Two times a day (BID) | ORAL | 2 refills | Status: AC

## 2021-05-19 MED ORDER — SUCRALFATE 1 G PO TABS: 1.0000 g | ORAL_TABLET | Freq: Three times a day (TID) | ORAL | 2 refills | Status: AC

## 2021-05-19 NOTE — Telephone Encounter (Signed)
MEDICATION: omeprazole (PRILOSEC) 40 MG capsule  sucralfate (CARAFATE) 1 g tablet    PHARMACY:  [id number] - [place], [place] - [address] AT [facility] Phone:  [phone]  Fax:  [phone]      Comments:   **Let patient know to contact pharmacy at the end of the day to make sure medication is ready. **  ** Please notify patient to allow 48-72 hours to process**  **Encourage patient to contact the pharmacy for refills or they can request refills through [facility]**

## 2021-05-19 NOTE — Telephone Encounter (Signed)
Rx sent to pharmacy   

## 2021-05-27 ENCOUNTER — Encounter: Payer: Self-pay | Admitting: Physician Assistant

## 2021-05-27 ENCOUNTER — Ambulatory Visit (INDEPENDENT_AMBULATORY_CARE_PROVIDER_SITE_OTHER)
Admission: RE | Admit: 2021-05-27 | Discharge: 2021-05-27 | Disposition: A | Discharge status: Home / Self Care | Payer: Medicaid Other | Source: Ambulatory Visit | Attending: Physician Assistant | Admitting: Physician Assistant

## 2021-05-27 ENCOUNTER — Ambulatory Visit (INDEPENDENT_AMBULATORY_CARE_PROVIDER_SITE_OTHER): Payer: Medicaid Other | Admitting: Physician Assistant

## 2021-05-27 ENCOUNTER — Other Ambulatory Visit: Payer: Self-pay

## 2021-05-27 VITALS — BP 118/78 | HR 70 | Temp 97.3°F | Wt 171.4 lb

## 2021-05-27 DIAGNOSIS — M25552 Pain in left hip: Secondary | ICD-10-CM | POA: Diagnosis not present

## 2021-05-27 DIAGNOSIS — R0781 Pleurodynia: Secondary | ICD-10-CM | POA: Diagnosis not present

## 2021-05-27 MED ORDER — KETOROLAC TROMETHAMINE 10 MG PO TABS: 10.0000 mg | ORAL_TABLET | Freq: Two times a day (BID) | ORAL | 0 refills | Status: AC | PRN

## 2021-05-27 MED ORDER — METHOCARBAMOL 500 MG PO TABS: 500.0000 mg | ORAL_TABLET | Freq: Three times a day (TID) | ORAL | 0 refills | Status: AC | PRN

## 2021-05-27 MED ORDER — KETOROLAC TROMETHAMINE 60 MG/2ML IM SOLN
60.0000 mg | Freq: Once | INTRAMUSCULAR | Status: AC
  Administered 2021-05-27: 60 mg via INTRAMUSCULAR

## 2021-05-27 NOTE — Progress Notes (Signed)
Allen Berger is a 30 y.o. male here for a recurrence of a previously resolved problem.  History of Present Illness:   Chief Complaint  Patient presents with   Back Pain    Sunday started experiencing upper left sided back pain Did have a fall [place] 1[place]5 years ago when [employer] Pain is radiating down into his thigh     HPI  L back pain On Sunday, he was walking and sudden onset of pain in his L upper back/rib area. Pain is bothersome with movement, he cannot get comfortable when laying down at night. If he takes a deep breath, he also has pain. Denies: cough, fever, chills, nausea, vomiting, saddle anesthesia, bowel/bladder incontinence.  L hip pain Reports L hip pain that is traveling to his left thigh. He has not taken anything for his symptoms. Started on Sunday also. Getting slight numbness to his thigh. Has had sciatic nerve issues in the past and feels like this is what this is.   Past Medical History:  Diagnosis Date   Healthy adult on routine physical examination    Muscle pain      Social History   Tobacco Use   Smoking status: Some Days    Types: Cigarettes    Start date: 11/20/2020   Smokeless tobacco: Never   Tobacco comments:    about 2 a day now- heavier in [place]  Vaping Use   Vaping Use: Never used  Substance Use Topics   Alcohol use: Not Currently    Comment: rare social   Drug use: Never    Past Surgical History:  Procedure Laterality Date   none      Family History  Problem Relation Age of Onset   Healthy Mother    Hypertension Father    Healthy Sister    Healthy Brother    Other Maternal Grandfather        died of "black magic"- disordered thinking reportedly   Heart attack Paternal Grandfather        70 s   Healthy Sister    Healthy Sister    Healthy Sister    Healthy Sister    Healthy Brother    Healthy Brother    Healthy Brother     No Known Allergies  Current Medications:   Current Outpatient  Medications:    gabapentin (NEURONTIN) 300 MG capsule, Take 1 capsule (300 mg total) by mouth 3 (three) times daily as needed., Disp: 90 capsule, Rfl: 3   ketorolac (TORADOL) 10 MG tablet, Take 1 tablet (10 mg total) by mouth every 12 (twelve) hours as needed., Disp: 20 tablet, Rfl: 0   methocarbamol (ROBAXIN) 500 MG tablet, Take 1 tablet (500 mg total) by mouth every 8 (eight) hours as needed for muscle spasms., Disp: 30 tablet, Rfl: 0   omeprazole (PRILOSEC) 40 MG capsule, Take 1 capsule (40 mg total) by mouth 2 (two) times daily before a meal., Disp: 60 capsule, Rfl: 2   sucralfate (CARAFATE) 1 g tablet, Take 1 tablet (1 g total) by mouth 4 (four) times daily -  with meals and at bedtime., Disp: 30 tablet, Rfl: 2   erythromycin ophthalmic ointment, One-half inch (1.25 cm) four times daily for 5 to 7 days to affected eye (left eye) (Patient not taking: Reported on 05/27/2021), Disp: 3.5 g, Rfl: 0   ferrous sulfate 325 (65 FE) MG EC tablet, Take 1 tablet (325 mg total) by mouth every 3 (three) days. (Patient not taking: Reported on 05/27/2021),  Disp: 39 tablet, Rfl: 1   predniSONE (DELTASONE) 50 MG tablet, Take 1 tablet (50 mg total) by mouth daily. (Patient not taking: Reported on 05/27/2021), Disp: 5 tablet, Rfl: 0   Review of Systems:   ROS Negative unless otherwise specified per HPI.   Vitals:   Vitals:   05/27/21 1505  BP: 118/78  Pulse: 70  Temp: (!) 97.3 F (36.3 C)  TempSrc: Temporal  SpO2: 99%  Weight: 171 lb 6.4 oz (77.7 kg)     Body mass index is 26.06 kg/m.  Physical Exam:   Physical Exam Vitals and nursing note reviewed.  Constitutional:      General: He is not in acute distress.    Appearance: He is well-developed. He is not ill-appearing or toxic-appearing.  Cardiovascular:     Rate and Rhythm: Normal rate and regular rhythm.     Pulses: Normal pulses.     Heart sounds: Normal heart sounds, S1 normal and S2 normal.     Comments: No LE edema Pulmonary:      Effort: Pulmonary effort is normal.     Breath sounds: Normal breath sounds.  Musculoskeletal:       Back:     Comments: Decreased ROM 2/2 pain with flexion/extension, lateral side bends, and rotation. Reproducible tenderness with deep palpation to L SI area. No bony tenderness.    Skin:    General: Skin is warm and dry.  Neurological:     Mental Status: He is alert.     GCS: GCS eye subscore is 4. GCS verbal subscore is 5. GCS motor subscore is 6.  Psychiatric:        Speech: Speech normal.        Behavior: Behavior normal. Behavior is cooperative.     Assessment and Plan:   John was seen today for back pain.  Diagnoses and all orders for this visit:  Rib pain on left side No red flags on exam Vitals stable Suspect MSK-related Xray ordered per patient request Toradol injection today, oral toradol may start tomorrow Robaxin prn Recommend follow-up with Dr. Denyse Amass if no improvement If any worsening, needs ER -     DG Ribs Unilateral W/Chest Left; Future -     ketorolac (TORADOL) injection 60 mg  Left hip pain Normal ROM Suspect strain Trial toradol and robaxin as above Recommend follow-up with Dr. Denyse Amass if no improvement If any worsening, needs to let us know  He declined prednisone and gabapentin as prescribed in the past because he states that it did not work  Other orders -     ketorolac (TORADOL) 10 MG tablet; Take 1 tablet (10 mg total) by mouth every 12 (twelve) hours as needed. -     methocarbamol (ROBAXIN) 500 MG tablet; Take 1 tablet (500 mg total) by mouth every 8 (eight) hours as needed for muscle spasms.  Jarold Motto, PA-C

## 2021-05-27 NOTE — Patient Instructions (Signed)
It was great to see you!  We gave you a toradol injection today for pain and inflammation  I have also sent this in as an oral pill form that you may take tomorrow  You may also start muscle relaxer, robaxin, tonight -- however please note that this may make you sleepy  Please call Dr. Zollie Pee office to follow-up if you do not feel better in a few days or if you feel worse Dr. Clementeen Graham with [facility]. His location: Psychiatrist Medicine at [facility] [address] on the 1st floor.   Phone number [phone], Fax [phone].   An order for an xray has been put in for you. To get your xray, you can walk in at the [facility] location without a scheduled appointment.  The address is [address] It is across the street from [facility]. X-ray is located in the basement.  Hours of operation are M-F 8:30am to 5:00pm. Please note that they are closed for lunch between 12:30 and 1:00pm.   Take care,  Jarold Motto PA-C

## 2021-09-11 ENCOUNTER — Telehealth: Payer: Self-pay

## 2021-09-11 NOTE — Telephone Encounter (Signed)
[employer] called and stated that he is helping Ibraham and his wife get a green card. He stated that they are needing vaccine records and any lab work faxed over to Dr [employer] office. He stated that they are seeing Dr Julio Sicks because they need a Estate manager/land agent in order to get a green card. Mat Carne stated that the records need to be faxed to   [phone]. Mat Carne can be reached at  [phone] with any questions. Please Advise.

## 2021-09-11 NOTE — Telephone Encounter (Signed)
This will need to go through medical records.

## 2021-09-14 NOTE — Telephone Encounter (Signed)
LVM with Mat Carne, he is aware

## 2021-09-18 ENCOUNTER — Ambulatory Visit (INDEPENDENT_AMBULATORY_CARE_PROVIDER_SITE_OTHER): Payer: Medicaid Other | Admitting: Family Medicine

## 2021-09-18 ENCOUNTER — Encounter: Payer: Self-pay | Admitting: Family Medicine

## 2021-09-18 ENCOUNTER — Other Ambulatory Visit: Payer: Self-pay

## 2021-09-18 VITALS — BP 128/72 | HR 86 | Temp 98.1°F | Ht 68.0 in | Wt 170.2 lb

## 2021-09-18 DIAGNOSIS — H109 Unspecified conjunctivitis: Secondary | ICD-10-CM | POA: Diagnosis not present

## 2021-09-18 DIAGNOSIS — M545 Low back pain, unspecified: Secondary | ICD-10-CM | POA: Diagnosis not present

## 2021-09-18 DIAGNOSIS — L509 Urticaria, unspecified: Secondary | ICD-10-CM

## 2021-09-18 DIAGNOSIS — H119 Unspecified disorder of conjunctiva: Secondary | ICD-10-CM | POA: Diagnosis not present

## 2021-09-18 NOTE — Progress Notes (Signed)
° °  Allen Berger is a 30 y.o. male who presents today for an office visit.  History provided by patient via a Animator # [id number].   Assessment/Plan:  Conjunctivitis Likely allergic. He does not have any red flag signs or symptoms. He will be starting OTC antihistamine as below which should help.   Pigmented Eye Lesion Given that it seems to growing in size will place referral to ophthalmology for evaluation.   Urticaria No red flags.  Recommended starting over-the-counter antihistamine such as Claritin or Zyrtec.  He would like to have a referral to see an allergist.  Will place referral today.  Low Back Pain No red flags but symptoms did not improve with Toradol and other conservative measures.  Recently had x-ray which was normal.  Likely muscular though will have him follow back up with sports medicine given lack of improvement.     Subjective:  HPI:  Patient here with a multitude of symptoms that he would like to discuss today.  His first concern is that he has had watery eyes for the last 3 weeks ago. No injuries.  Patient stated that he was looking up and his eyes would become watery. He did not take any medications or any other treatments. Symptoms seem to improving. No pain. He is concerned because he has noticed a slowly enlarging hyperpigmented area in the inner part of his eye. No vision changes.   He is also concerned about intermittent red spots that pop on on his skin randomly. Right now has a few lesions on his hand. He notes that if he applies pressure to the area then he will develop symptoms. Associated with itching and swelling. This also happens if he picks up anything heavy. Started about a month ago. No recent medications changes. Scratching his skin will cause a raised area.   He has also had issues with severe back pain. HE was seen here about 3 months ago by a different provider. Symptoms have been persistent for the last several months.         Objective:  Physical Exam: BP 128/72 (BP Location: Left Arm)    Pulse 86    Temp 98.1 F (36.7 C) (Temporal)    Ht 5\' 8"  (1.727 m)    Wt 170 lb 3.2 oz (77.2 kg)    SpO2 98%    BMI 25.88 kg/m   Gen: No acute distress, resting comfortably HEENT: Hyperpigmented linear lesion on medial aspect of left eye conjunctiva. Visual acuity grossly intact.  Slight conjunctival erythema noted. Skin: Urticaria noted across hands upper extremities. Dermatographism present.  MSK  - Back: No deformities. Non tender to palpation  Neuro: Grossly normal, moves all extremities Psych: Normal affect and thought content       Time Spent: 45 minutes of total time was spent on the date of the encounter performing the following actions: chart review prior to seeing the patient including recent visits, obtaining history, performing a medically necessary exam, counseling on the treatment plan, placing orders, and documenting in our EHR.   . Katina Degree, MD 09/18/2021 2:03 PM

## 2021-09-18 NOTE — Patient Instructions (Signed)
It was very nice to see you today!  Please take an over-the-counter allergy medication such as Claritin or Zyrtec.  I will refer you to see an eye doctor for the spot on your eye.  Please follow-up with the sports medicine doctor for your back.  We will also refer you to see an allergist.  Take care, Dr Jimmey Ralph  PLEASE NOTE:  If you had any lab tests please let us know if you have not heard back within a few days. You may see your results on mychart before we have a chance to review them but we will give you a call once they are reviewed by [place]. If we ordered any referrals today, please let us know if you have not heard from their office within the next week.   Please try these tips to maintain a healthy lifestyle:  Eat at least 3 REAL meals and 1-2 snacks per day.  Aim for no more than 5 hours between eating.  If you eat breakfast, please do so within one hour of getting up.   Each meal should contain half fruits/vegetables, one quarter protein, and one quarter carbs (no bigger than a computer mouse)  Cut down on sweet beverages. This includes juice, soda, and sweet tea.   Drink at least 1 glass of water with each meal and aim for at least 8 glasses per day  Exercise at least 150 minutes every week.

## 2021-10-08 ENCOUNTER — Telehealth: Payer: Self-pay

## 2021-10-08 NOTE — Telephone Encounter (Signed)
Patient states his appt with an allergist is the beginning of February.    Would like to know if we can find a facility that will see him sooner.

## 2022-06-28 ENCOUNTER — Encounter: Payer: Self-pay | Admitting: *Deleted

## 2022-09-16 ENCOUNTER — Encounter: Payer: Self-pay | Admitting: *Deleted

## 2023-01-24 IMAGING — DX DG HIP (WITH OR WITHOUT PELVIS) 2-3V*L*
3 series · 3 of 3 positions shown · non-contrast
Comparison: None.

CLINICAL DATA: Left hip pain

EXAM:
DG HIP (WITH OR WITHOUT PELVIS) 2-3V LEFT

[pelvis ap]
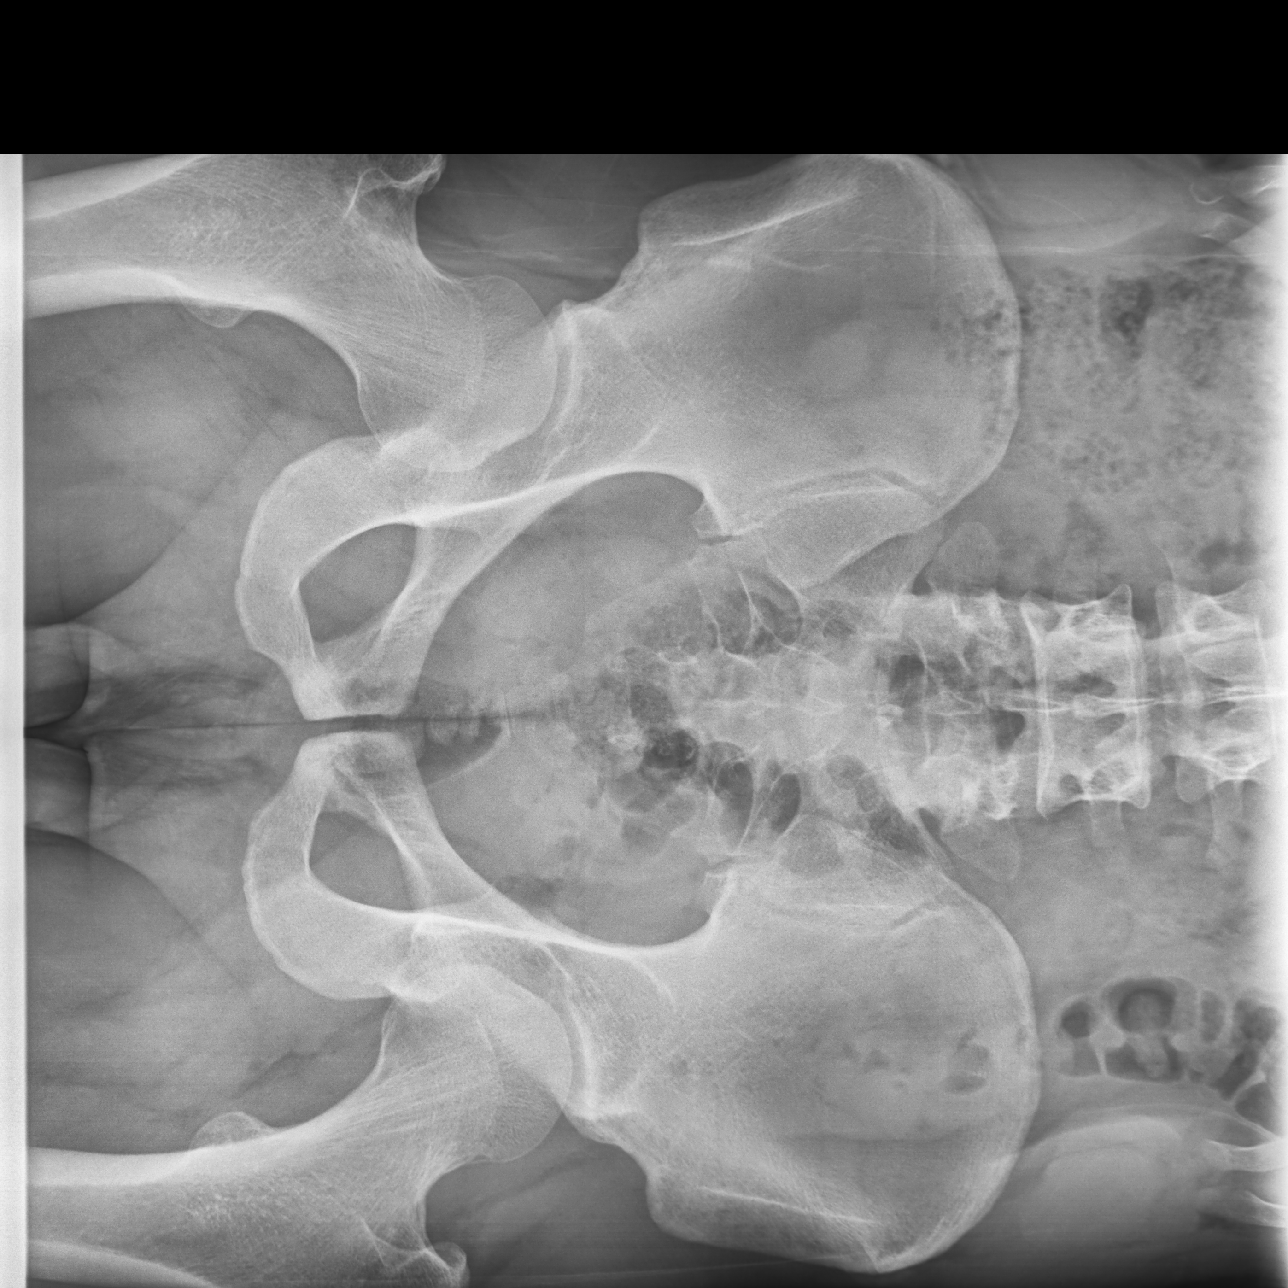

[hip joint ap]
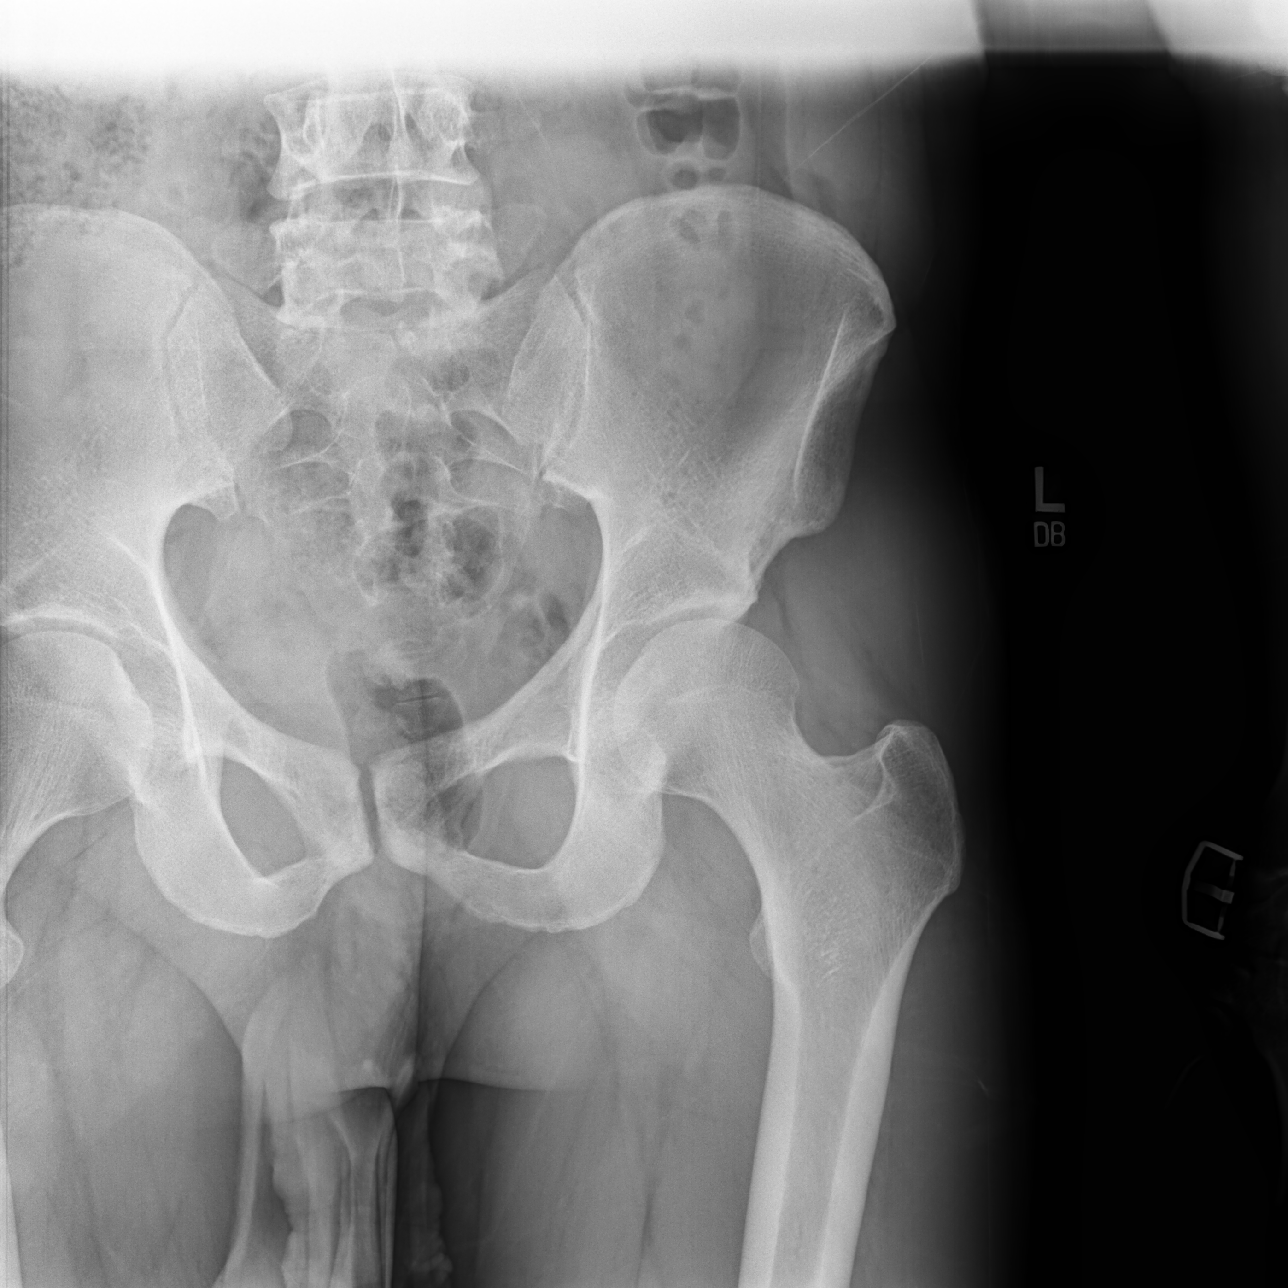

[hip joint (frog view)]
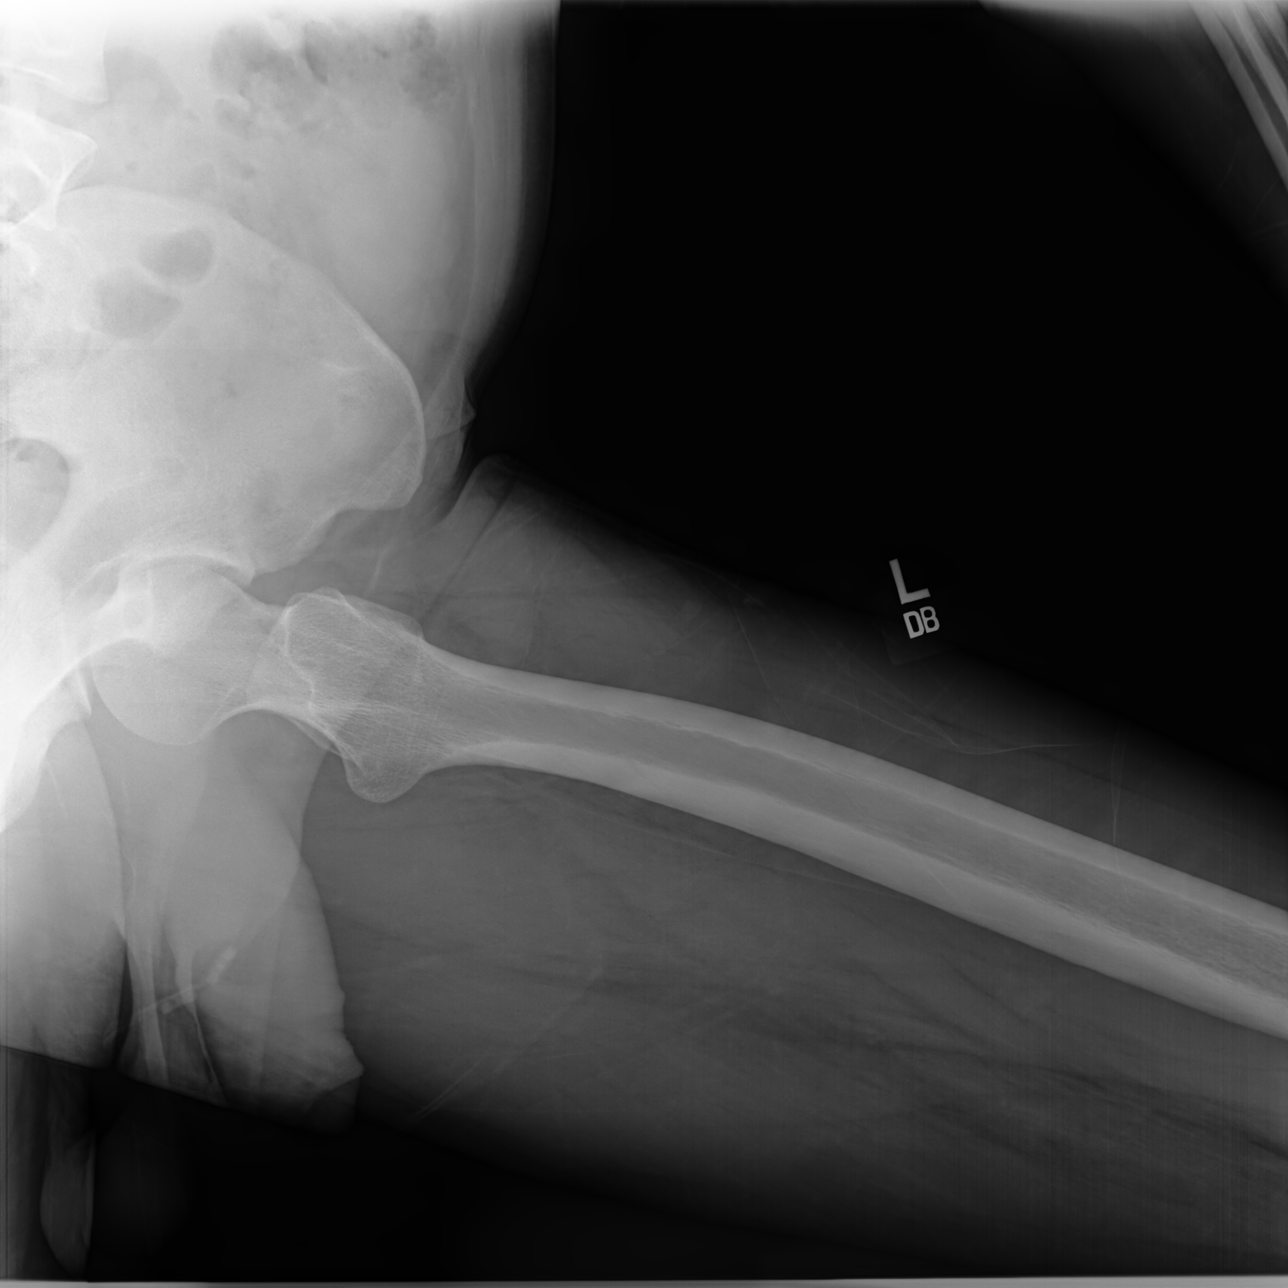

[3 of 3 positions shown; findings below may reference images not displayed]

FINDINGS: There is no evidence of hip fracture or dislocation. There is no
evidence of arthropathy or other focal bone abnormality.
IMPRESSION: Negative.
